# Patient Record
Sex: Female | Born: 1970 | Marital: Married | Smoking: Current every day smoker
Health system: Southern US, Academic
[De-identification: ages and names within clinical notes are randomized; demographics above are authoritative.]

## PROBLEM LIST (undated history)

## (undated) DIAGNOSIS — I10 Essential (primary) hypertension: Secondary | ICD-10-CM

## (undated) DIAGNOSIS — K589 Irritable bowel syndrome without diarrhea: Secondary | ICD-10-CM

## (undated) DIAGNOSIS — J45909 Unspecified asthma, uncomplicated: Secondary | ICD-10-CM

## (undated) DIAGNOSIS — E559 Vitamin D deficiency, unspecified: Secondary | ICD-10-CM

## (undated) DIAGNOSIS — K219 Gastro-esophageal reflux disease without esophagitis: Secondary | ICD-10-CM

## (undated) DIAGNOSIS — K449 Diaphragmatic hernia without obstruction or gangrene: Secondary | ICD-10-CM

## (undated) DIAGNOSIS — F419 Anxiety disorder, unspecified: Secondary | ICD-10-CM

## (undated) DIAGNOSIS — E7800 Pure hypercholesterolemia, unspecified: Secondary | ICD-10-CM

## (undated) DIAGNOSIS — R7303 Prediabetes: Secondary | ICD-10-CM

## (undated) HISTORY — DX: Anxiety disorder, unspecified: F41.9

## (undated) HISTORY — DX: Vitamin D deficiency, unspecified: E55.9

## (undated) HISTORY — DX: Essential (primary) hypertension: I10

## (undated) HISTORY — PX: ESOPHAGOGASTRODUODENOSCOPY: SHX1529

## (undated) HISTORY — PX: COLONOSCOPY: SHX174

## (undated) HISTORY — DX: Gastro-esophageal reflux disease without esophagitis: K21.9

## (undated) HISTORY — PX: HX RHINOPLASTY: SHX31

## (undated) HISTORY — DX: Pure hypercholesterolemia, unspecified: E78.00

## (undated) HISTORY — PX: HX APPENDECTOMY: SHX54

## (undated) HISTORY — DX: Prediabetes: R73.03

## (undated) HISTORY — DX: Unspecified asthma, uncomplicated: J45.909

## (undated) HISTORY — PX: HX GALL BLADDER SURGERY/CHOLE: SHX55

## (undated) HISTORY — PX: HX TONSILLECTOMY: SHX27

## (undated) HISTORY — DX: Diaphragmatic hernia without obstruction or gangrene: K44.9

## (undated) HISTORY — DX: Irritable bowel syndrome, unspecified: K58.9

---

## 1998-03-25 ENCOUNTER — Other Ambulatory Visit (HOSPITAL_COMMUNITY): Payer: Self-pay

## 1999-01-18 ENCOUNTER — Emergency Department (HOSPITAL_COMMUNITY): Admission: EM | Admit: 1999-01-18 | Discharge: 1999-01-19 | Payer: Self-pay | Admitting: Emergency Medicine

## 2000-10-09 ENCOUNTER — Encounter: Payer: Self-pay | Admitting: Internal Medicine

## 2000-10-09 ENCOUNTER — Emergency Department (HOSPITAL_COMMUNITY): Admission: EM | Admit: 2000-10-09 | Discharge: 2000-10-09 | Payer: Self-pay | Admitting: Emergency Medicine

## 2000-10-09 ENCOUNTER — Encounter: Payer: Self-pay | Admitting: Emergency Medicine

## 2017-02-09 DIAGNOSIS — F17200 Nicotine dependence, unspecified, uncomplicated: Secondary | ICD-10-CM | POA: Insufficient documentation

## 2017-08-24 ENCOUNTER — Ambulatory Visit (HOSPITAL_BASED_OUTPATIENT_CLINIC_OR_DEPARTMENT_OTHER): Payer: Self-pay | Admitting: Rheumatology

## 2020-11-19 ENCOUNTER — Other Ambulatory Visit: Payer: Self-pay

## 2020-11-19 ENCOUNTER — Emergency Department (HOSPITAL_COMMUNITY): Payer: Medicaid - Out of State

## 2020-11-19 ENCOUNTER — Emergency Department (HOSPITAL_COMMUNITY)
Admission: EM | Admit: 2020-11-19 | Discharge: 2020-11-20 | Disposition: A | Discharge status: Home / Self Care | Payer: Medicaid - Out of State | Attending: Emergency Medicine | Admitting: Emergency Medicine

## 2020-11-19 ENCOUNTER — Encounter (HOSPITAL_COMMUNITY): Payer: Self-pay | Admitting: Emergency Medicine

## 2020-11-19 DIAGNOSIS — R030 Elevated blood-pressure reading, without diagnosis of hypertension: Secondary | ICD-10-CM

## 2020-11-19 DIAGNOSIS — R110 Nausea: Secondary | ICD-10-CM | POA: Diagnosis not present

## 2020-11-19 DIAGNOSIS — R519 Headache, unspecified: Secondary | ICD-10-CM | POA: Diagnosis present

## 2020-11-19 DIAGNOSIS — I10 Essential (primary) hypertension: Secondary | ICD-10-CM | POA: Diagnosis not present

## 2020-11-19 DIAGNOSIS — F17210 Nicotine dependence, cigarettes, uncomplicated: Secondary | ICD-10-CM | POA: Diagnosis not present

## 2020-11-19 HISTORY — DX: Essential (primary) hypertension: I10

## 2020-11-19 LAB — CBC WITH DIFFERENTIAL/PLATELET
Abs Immature Granulocytes: 0.03 10*3/uL (ref 0.00–0.07)
Basophils Absolute: 0.1 10*3/uL (ref 0.0–0.1)
Basophils Relative: 1 %
Eosinophils Absolute: 0.3 10*3/uL (ref 0.0–0.5)
Eosinophils Relative: 2 %
HCT: 47.2 % — ABNORMAL HIGH (ref 36.0–46.0)
Hemoglobin: 16.1 g/dL — ABNORMAL HIGH (ref 12.0–15.0)
Immature Granulocytes: 0 %
Lymphocytes Relative: 37 %
Lymphs Abs: 3.9 10*3/uL (ref 0.7–4.0)
MCH: 31 pg (ref 26.0–34.0)
MCHC: 34.1 g/dL (ref 30.0–36.0)
MCV: 90.8 fL (ref 80.0–100.0)
Monocytes Absolute: 0.6 10*3/uL (ref 0.1–1.0)
Monocytes Relative: 6 %
Neutro Abs: 5.7 10*3/uL (ref 1.7–7.7)
Neutrophils Relative %: 54 %
Platelets: 235 10*3/uL (ref 150–400)
RBC: 5.2 MIL/uL — ABNORMAL HIGH (ref 3.87–5.11)
RDW: 13.2 % (ref 11.5–15.5)
WBC: 10.6 10*3/uL — ABNORMAL HIGH (ref 4.0–10.5)
nRBC: 0 % (ref 0.0–0.2)

## 2020-11-19 LAB — COMPREHENSIVE METABOLIC PANEL
ALT: 16 U/L (ref 0–44)
AST: 17 U/L (ref 15–41)
Albumin: 4.1 g/dL (ref 3.5–5.0)
Alkaline Phosphatase: 70 U/L (ref 38–126)
Anion gap: 7 (ref 5–15)
BUN: 5 mg/dL — ABNORMAL LOW (ref 6–20)
CO2: 27 mmol/L (ref 22–32)
Calcium: 9.8 mg/dL (ref 8.9–10.3)
Chloride: 100 mmol/L (ref 98–111)
Creatinine, Ser: 0.66 mg/dL (ref 0.44–1.00)
GFR, Estimated: >60 mL/min (ref >60)
Glucose, Bld: 109 mg/dL — ABNORMAL HIGH (ref 70–99)
Potassium: 3.9 mmol/L (ref 3.5–5.1)
Sodium: 134 mmol/L — ABNORMAL LOW (ref 135–145)
Total Bilirubin: 0.8 mg/dL (ref 0.3–1.2)
Total Protein: 7.1 g/dL (ref 6.5–8.1)

## 2020-11-19 NOTE — ED Provider Notes (Signed)
Emergency Medicine Provider Triage Evaluation Note  Emily Lang , a 50 y.o. female  was evaluated in triage.  Pt complains of headache and high blood pressure.  Patient with a history of high blood pressure, has been taking medication as prescribed.  She takes it at night, thus has not yet had it today.  Today around 10 AM she developed a severe headache which is been constant since.  She took some Tylenol without significant improvement.  Associated photophobia and nausea.  No vomiting.  No history of migraines.  Review of Systems  Positive: HA Negative: fever  Physical Exam  BP (!) 192/95 (BP Location: Right Arm)   Pulse 77   Temp 98.5 F (36.9 C) (Oral)   Resp 18   Ht 5\' 4"  (1.626 m)   Wt 68 kg   SpO2 100%   BMI 25.75 kg/m  Gen:   Awake, no distress   Resp:  Normal effort  MSK:   Moves extremities without difficulty   Medical Decision Making  Medically screening exam initiated at 7:20 PM.  Appropriate orders placed.  Emily Lang was informed that the remainder of the evaluation will be completed by another provider, this initial triage assessment does not replace that evaluation, and the importance of remaining in the ED until their evaluation is complete.  Labs, ct   Erle Crocker, PA-C 11/19/20 01/19/21, MD 11/20/20 1143

## 2020-11-19 NOTE — ED Triage Notes (Signed)
Pt here via [facility] from home for HTN and HA. Pt initially 190/120 for EMS, came down to 138/90. Pt still endorsing 10/10 HA. 18g LAC

## 2020-11-20 ENCOUNTER — Emergency Department (HOSPITAL_COMMUNITY): Payer: Medicaid - Out of State

## 2020-11-20 LAB — TROPONIN I (HIGH SENSITIVITY): Troponin I (High Sensitivity): 6 ng/L (ref <18)

## 2020-11-20 MED ORDER — DIPHENHYDRAMINE HCL 50 MG/ML IJ SOLN
25.0000 mg | Freq: Once | INTRAMUSCULAR | Status: AC
  Administered 2020-11-20: 25 mg via INTRAVENOUS
  Filled 2020-11-20: qty 1

## 2020-11-20 MED ORDER — KETOROLAC TROMETHAMINE 30 MG/ML IJ SOLN
30.0000 mg | Freq: Once | INTRAMUSCULAR | Status: AC
  Administered 2020-11-20: 30 mg via INTRAVENOUS
  Filled 2020-11-20: qty 1

## 2020-11-20 MED ORDER — ATENOLOL 50 MG PO TABS: 50.0000 mg | ORAL_TABLET | Freq: Every evening | ORAL | 0 refills | Status: AC

## 2020-11-20 MED ORDER — IOHEXOL 350 MG/ML SOLN
75.0000 mL | Freq: Once | INTRAVENOUS | Status: AC | PRN
  Administered 2020-11-20: 75 mL via INTRAVENOUS

## 2020-11-20 MED ORDER — METOCLOPRAMIDE HCL 5 MG/ML IJ SOLN
10.0000 mg | Freq: Once | INTRAMUSCULAR | Status: AC
  Administered 2020-11-20: 10 mg via INTRAVENOUS
  Filled 2020-11-20: qty 2

## 2020-11-20 MED ORDER — OMEPRAZOLE 40 MG PO CPDR: 40.0000 mg | DELAYED_RELEASE_CAPSULE | Freq: Every evening | ORAL | 0 refills | Status: AC
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
## 2020-11-20 NOTE — ED Notes (Signed)
pts headache is gone totally

## 2020-11-20 NOTE — ED Notes (Signed)
Patient given discharge instructions. Questions were answered. Patient verbalized understanding of discharge instructions and care at home.  

## 2020-11-20 NOTE — ED Notes (Signed)
Headache better almost gone

## 2020-11-20 NOTE — ED Notes (Signed)
To ct

## 2020-11-20 NOTE — ED Notes (Signed)
Pt given sprite and tolerated well, now asking for food

## 2020-11-20 NOTE — Discharge Instructions (Addendum)
Your testing is reassuring.  Take your blood pressure medications as prescribed and follow-up with your doctor.  Return to the ED with exertional chest pain, shortness of breath, nausea, vomiting, sweating or any other concerns.

## 2021-02-12 ENCOUNTER — Emergency Department (HOSPITAL_COMMUNITY)
Admission: EM | Admit: 2021-02-12 | Discharge: 2021-02-12 | Disposition: A | Discharge status: Home / Self Care | Payer: Medicaid - Out of State | Attending: Emergency Medicine | Admitting: Emergency Medicine

## 2021-02-12 ENCOUNTER — Emergency Department (HOSPITAL_COMMUNITY): Payer: Medicaid - Out of State

## 2021-02-12 ENCOUNTER — Other Ambulatory Visit: Payer: Self-pay

## 2021-02-12 ENCOUNTER — Encounter (HOSPITAL_COMMUNITY): Payer: Self-pay

## 2021-02-12 DIAGNOSIS — S060X0A Concussion without loss of consciousness, initial encounter: Secondary | ICD-10-CM | POA: Insufficient documentation

## 2021-02-12 DIAGNOSIS — F17210 Nicotine dependence, cigarettes, uncomplicated: Secondary | ICD-10-CM | POA: Insufficient documentation

## 2021-02-12 DIAGNOSIS — J019 Acute sinusitis, unspecified: Secondary | ICD-10-CM | POA: Diagnosis not present

## 2021-02-12 DIAGNOSIS — I10 Essential (primary) hypertension: Secondary | ICD-10-CM | POA: Diagnosis not present

## 2021-02-12 DIAGNOSIS — Z79899 Other long term (current) drug therapy: Secondary | ICD-10-CM | POA: Insufficient documentation

## 2021-02-12 DIAGNOSIS — Z7982 Long term (current) use of aspirin: Secondary | ICD-10-CM | POA: Diagnosis not present

## 2021-02-12 DIAGNOSIS — S0990XA Unspecified injury of head, initial encounter: Secondary | ICD-10-CM | POA: Diagnosis present

## 2021-02-12 MED ORDER — ACETAMINOPHEN 500 MG PO TABS
1000.0000 mg | ORAL_TABLET | Freq: Once | ORAL | Status: AC
  Administered 2021-02-12: 1000 mg via ORAL
  Filled 2021-02-12: qty 2

## 2021-02-12 MED ORDER — CYCLOBENZAPRINE HCL 10 MG PO TABS: 10.0000 mg | ORAL_TABLET | Freq: Two times a day (BID) | ORAL | 0 refills | Status: AC | PRN

## 2021-02-12 MED ORDER — AMOXICILLIN-POT CLAVULANATE 875-125 MG PO TABS: 1.0000 | ORAL_TABLET | Freq: Two times a day (BID) | ORAL | 0 refills | Status: AC

## 2021-02-12 MED ORDER — IBUPROFEN 600 MG PO TABS: 600.0000 mg | ORAL_TABLET | Freq: Four times a day (QID) | ORAL | 0 refills | Status: AC | PRN
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
## 2021-02-16 ENCOUNTER — Ambulatory Visit: Payer: Self-pay | Admitting: *Deleted
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
## 2021-02-24 ENCOUNTER — Encounter (HOSPITAL_COMMUNITY): Payer: Self-pay

## 2021-02-24 ENCOUNTER — Emergency Department (HOSPITAL_COMMUNITY)
Admission: EM | Admit: 2021-02-24 | Discharge: 2021-02-25 | Disposition: A | Discharge status: Home / Self Care | Payer: Medicaid - Out of State | Attending: Emergency Medicine | Admitting: Emergency Medicine

## 2021-02-24 DIAGNOSIS — Z5321 Procedure and treatment not carried out due to patient leaving prior to being seen by health care provider: Secondary | ICD-10-CM | POA: Diagnosis not present

## 2021-02-24 DIAGNOSIS — Z7982 Long term (current) use of aspirin: Secondary | ICD-10-CM | POA: Insufficient documentation

## 2021-02-24 DIAGNOSIS — R519 Headache, unspecified: Secondary | ICD-10-CM | POA: Insufficient documentation

## 2021-02-24 NOTE — ED Notes (Signed)
Called x1. No answer.
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
## 2021-09-17 ENCOUNTER — Other Ambulatory Visit: Payer: Self-pay

## 2021-09-17 ENCOUNTER — Other Ambulatory Visit: Payer: MEDICAID | Attending: FAMILY PRACTICE

## 2021-09-17 DIAGNOSIS — R1084 Generalized abdominal pain: Secondary | ICD-10-CM | POA: Insufficient documentation

## 2021-09-17 DIAGNOSIS — K921 Melena: Secondary | ICD-10-CM | POA: Insufficient documentation

## 2021-09-17 DIAGNOSIS — E538 Deficiency of other specified B group vitamins: Secondary | ICD-10-CM | POA: Insufficient documentation

## 2021-09-17 LAB — CBC WITH DIFF
BASOPHIL #: 0.1 10*3/uL (ref 0.00–0.30)
BASOPHIL %: 1 % (ref 0–3)
EOSINOPHIL #: 0.3 10*3/uL (ref 0.00–0.80)
EOSINOPHIL %: 3 % (ref 0–7)
HCT: 40.3 % (ref 37.0–47.0)
HGB: 14 g/dL (ref 12.5–16.0)
LYMPHOCYTE #: 3.5 10*3/uL (ref 1.10–5.00)
LYMPHOCYTE %: 37 % (ref 25–45)
MCH: 31.2 pg (ref 27.0–32.0)
MCHC: 34.9 g/dL (ref 32.0–36.0)
MCV: 89.4 fL (ref 78.0–99.0)
MONOCYTE #: 0.6 10*3/uL (ref 0.00–1.30)
MONOCYTE %: 6 % (ref 0–12)
MPV: 11 fL — ABNORMAL HIGH (ref 7.4–10.4)
NEUTROPHIL #: 5.2 10*3/uL (ref 1.80–8.40)
NEUTROPHIL %: 54 % (ref 40–76)
PLATELETS: 247 10*3/uL (ref 140–440)
RBC: 4.5 10*6/uL (ref 4.20–5.40)
RDW: 14.3 % (ref 11.6–14.8)
WBC: 9.6 10*3/uL (ref 4.0–10.5)
WBCS UNCORRECTED: 9.6 10*3/uL

## 2021-09-17 LAB — COMPREHENSIVE METABOLIC PANEL, NON-FASTING
ALBUMIN/GLOBULIN RATIO: 1.5 — ABNORMAL HIGH (ref 0.8–1.4)
ALBUMIN: 4.4 g/dL (ref 3.5–5.7)
ALKALINE PHOSPHATASE: 69 U/L (ref 34–104)
ALT (SGPT): 13 U/L (ref 7–52)
ANION GAP: 8 mmol/L — ABNORMAL LOW (ref 10–20)
AST (SGOT): 18 U/L (ref 13–39)
BILIRUBIN TOTAL: 0.3 mg/dL (ref 0.3–1.2)
BUN/CREA RATIO: 8 (ref 6–22)
BUN: 6 mg/dL — ABNORMAL LOW (ref 7–25)
CALCIUM, CORRECTED: 9.3 mg/dL (ref 8.9–10.8)
CALCIUM: 9.7 mg/dL (ref 8.6–10.3)
CHLORIDE: 106 mmol/L (ref 98–107)
CO2 TOTAL: 25 mmol/L (ref 21–31)
CREATININE: 0.79 mg/dL (ref 0.60–1.30)
ESTIMATED GFR: 91 mL/min/{1.73_m2} (ref >59)
GLOBULIN: 2.9 (ref 2.9–5.4)
GLUCOSE: 97 mg/dL (ref 74–109)
OSMOLALITY, CALCULATED: 275 mOsm/kg (ref 270–290)
POTASSIUM: 4.3 mmol/L (ref 3.5–5.1)
PROTEIN TOTAL: 7.3 g/dL (ref 6.4–8.9)
SODIUM: 139 mmol/L (ref 136–145)

## 2021-09-17 LAB — VITAMIN D 25 TOTAL: VITAMIN D: 34 ng/mL (ref 30–100)

## 2021-09-17 LAB — VITAMIN B12: VITAMIN B 12: 208 pg/mL (ref 180–914)

## 2021-09-17 LAB — THYROID STIMULATING HORMONE (SENSITIVE TSH): TSH: 1.064 u[IU]/mL (ref 0.450–5.330)

## 2021-09-18 ENCOUNTER — Other Ambulatory Visit (HOSPITAL_COMMUNITY): Payer: Self-pay | Admitting: FAMILY PRACTICE

## 2021-09-18 DIAGNOSIS — Z1231 Encounter for screening mammogram for malignant neoplasm of breast: Secondary | ICD-10-CM

## 2021-09-28 ENCOUNTER — Ambulatory Visit (HOSPITAL_COMMUNITY): Payer: Self-pay

## 2021-09-30 ENCOUNTER — Other Ambulatory Visit: Payer: Self-pay

## 2021-09-30 ENCOUNTER — Inpatient Hospital Stay
Admission: RE | Admit: 2021-09-30 | Discharge: 2021-09-30 | Disposition: A | Discharge status: Home / Self Care | Payer: MEDICAID | Source: Ambulatory Visit | Attending: FAMILY PRACTICE | Admitting: FAMILY PRACTICE

## 2021-09-30 ENCOUNTER — Encounter (HOSPITAL_COMMUNITY): Payer: Self-pay

## 2021-09-30 DIAGNOSIS — Z1231 Encounter for screening mammogram for malignant neoplasm of breast: Secondary | ICD-10-CM | POA: Insufficient documentation

## 2021-10-06 ENCOUNTER — Encounter (INDEPENDENT_AMBULATORY_CARE_PROVIDER_SITE_OTHER): Payer: Self-pay | Admitting: Surgery

## 2021-10-13 ENCOUNTER — Ambulatory Visit (INDEPENDENT_AMBULATORY_CARE_PROVIDER_SITE_OTHER): Payer: Self-pay | Admitting: Surgery

## 2021-11-02 ENCOUNTER — Ambulatory Visit (INDEPENDENT_AMBULATORY_CARE_PROVIDER_SITE_OTHER): Payer: Self-pay | Admitting: Surgery

## 2021-11-02 ENCOUNTER — Encounter (INDEPENDENT_AMBULATORY_CARE_PROVIDER_SITE_OTHER): Payer: Self-pay

## 2021-11-06 ENCOUNTER — Encounter (INDEPENDENT_AMBULATORY_CARE_PROVIDER_SITE_OTHER): Payer: Self-pay | Admitting: Surgery

## 2021-11-06 NOTE — Nursing Note (Signed)
Sent no show letter.  Kelly Gardner  11/06/2021 10:20

## 2022-03-22 ENCOUNTER — Other Ambulatory Visit (HOSPITAL_COMMUNITY): Payer: Self-pay | Admitting: FAMILY PRACTICE

## 2022-03-22 ENCOUNTER — Other Ambulatory Visit: Payer: Self-pay

## 2022-03-22 ENCOUNTER — Other Ambulatory Visit: Payer: MEDICAID | Attending: FAMILY PRACTICE

## 2022-03-22 DIAGNOSIS — I10 Essential (primary) hypertension: Secondary | ICD-10-CM | POA: Insufficient documentation

## 2022-03-22 DIAGNOSIS — R0602 Shortness of breath: Secondary | ICD-10-CM | POA: Insufficient documentation

## 2022-03-22 DIAGNOSIS — R7303 Prediabetes: Secondary | ICD-10-CM | POA: Insufficient documentation

## 2022-03-22 DIAGNOSIS — I4902 Ventricular flutter: Secondary | ICD-10-CM | POA: Insufficient documentation

## 2022-03-22 DIAGNOSIS — I4892 Unspecified atrial flutter: Secondary | ICD-10-CM

## 2022-03-22 LAB — CBC WITH DIFF
BASOPHIL #: 0.1 10*3/uL (ref 0.00–0.10)
BASOPHIL %: 1 % (ref 0–1)
EOSINOPHIL #: 0.5 10*3/uL (ref 0.00–0.50)
EOSINOPHIL %: 6 %
HCT: 44.5 % — ABNORMAL HIGH (ref 31.2–41.9)
HGB: 15 g/dL — ABNORMAL HIGH (ref 10.9–14.3)
LYMPHOCYTE #: 2.9 10*3/uL (ref 1.00–3.00)
LYMPHOCYTE %: 33 % (ref 16–44)
MCH: 30.1 pg (ref 24.7–32.8)
MCHC: 33.6 g/dL (ref 32.3–35.6)
MCV: 89.6 fL (ref 75.5–95.3)
MONOCYTE #: 0.6 10*3/uL (ref 0.30–1.00)
MONOCYTE %: 7 % (ref 5–13)
MPV: 10.7 fL (ref 7.9–10.8)
NEUTROPHIL #: 4.8 10*3/uL (ref 1.85–7.80)
NEUTROPHIL %: 54 % (ref 43–77)
PLATELETS: 216 10*3/uL (ref 140–440)
RBC: 4.96 10*6/uL — ABNORMAL HIGH (ref 3.63–4.92)
RDW: 14.8 % (ref 12.3–17.7)
WBC: 8.8 10*3/uL (ref 3.8–11.8)

## 2022-03-22 LAB — HEPATIC FUNCTION PANEL
ALBUMIN/GLOBULIN RATIO: 1.7 — ABNORMAL HIGH (ref 0.8–1.4)
ALBUMIN: 4.3 g/dL (ref 3.5–5.7)
ALKALINE PHOSPHATASE: 75 U/L (ref 34–104)
ALT (SGPT): 9 U/L (ref 7–52)
AST (SGOT): 15 U/L (ref 13–39)
BILIRUBIN DIRECT: 0.11 md/dL (ref <=0.20)
BILIRUBIN TOTAL: 0.3 mg/dL (ref 0.3–1.2)
BILIRUBIN, INDIRECT: 0.19 mg/dL (ref <=1)
GLOBULIN: 2.6 — ABNORMAL LOW (ref 2.9–5.4)
PROTEIN TOTAL: 6.9 g/dL (ref 6.4–8.9)

## 2022-03-22 LAB — BASIC METABOLIC PANEL
ANION GAP: 5 mmol/L (ref 4–13)
BUN/CREA RATIO: 8 (ref 6–22)
BUN: 6 mg/dL — ABNORMAL LOW (ref 7–25)
CALCIUM: 9.7 mg/dL (ref 8.6–10.3)
CHLORIDE: 108 mmol/L — ABNORMAL HIGH (ref 98–107)
CO2 TOTAL: 27 mmol/L (ref 21–31)
CREATININE: 0.8 mg/dL (ref 0.60–1.30)
ESTIMATED GFR: 89 mL/min/{1.73_m2} (ref >59)
GLUCOSE: 102 mg/dL (ref 74–109)
OSMOLALITY, CALCULATED: 277 mOsm/kg (ref 270–290)
POTASSIUM: 4.3 mmol/L (ref 3.5–5.1)
SODIUM: 140 mmol/L (ref 136–145)

## 2022-03-22 LAB — MAGNESIUM: MAGNESIUM: 1.9 mg/dL (ref 1.9–2.7)

## 2022-03-22 LAB — THYROID STIMULATING HORMONE (SENSITIVE TSH): TSH: 1.568 u[IU]/mL (ref 0.450–5.330)

## 2022-03-22 LAB — HGA1C (HEMOGLOBIN A1C WITH EST AVG GLUCOSE): HEMOGLOBIN A1C: 5.5 % (ref 4.0–6.0)

## 2022-03-30 ENCOUNTER — Ambulatory Visit (HOSPITAL_COMMUNITY): Payer: MEDICAID

## 2022-03-30 ENCOUNTER — Ambulatory Visit (HOSPITAL_COMMUNITY): Payer: Self-pay

## 2022-04-20 ENCOUNTER — Ambulatory Visit (HOSPITAL_COMMUNITY): Payer: Self-pay

## 2022-04-20 ENCOUNTER — Ambulatory Visit (HOSPITAL_COMMUNITY): Payer: MEDICAID

## 2022-05-03 ENCOUNTER — Ambulatory Visit (HOSPITAL_COMMUNITY): Payer: Self-pay

## 2022-05-28 ENCOUNTER — Encounter (INDEPENDENT_AMBULATORY_CARE_PROVIDER_SITE_OTHER): Payer: Self-pay | Admitting: Surgery

## 2022-06-08 ENCOUNTER — Ambulatory Visit (HOSPITAL_COMMUNITY): Payer: Self-pay

## 2022-06-21 ENCOUNTER — Encounter (INDEPENDENT_AMBULATORY_CARE_PROVIDER_SITE_OTHER): Payer: Self-pay

## 2022-06-21 ENCOUNTER — Ambulatory Visit (INDEPENDENT_AMBULATORY_CARE_PROVIDER_SITE_OTHER): Payer: MEDICAID | Admitting: Surgery

## 2022-06-28 ENCOUNTER — Encounter (INDEPENDENT_AMBULATORY_CARE_PROVIDER_SITE_OTHER): Payer: Self-pay | Admitting: Surgery

## 2022-06-28 ENCOUNTER — Ambulatory Visit (HOSPITAL_COMMUNITY): Payer: Self-pay

## 2022-06-29 ENCOUNTER — Ambulatory Visit (INDEPENDENT_AMBULATORY_CARE_PROVIDER_SITE_OTHER): Payer: Self-pay | Admitting: Surgery

## 2023-02-09 ENCOUNTER — Other Ambulatory Visit (INDEPENDENT_AMBULATORY_CARE_PROVIDER_SITE_OTHER): Payer: Self-pay | Admitting: PHYSICIAN ASSISTANT

## 2023-02-09 MED ORDER — ATENOLOL 50 MG TABLET
50.0000 mg | ORAL_TABLET | Freq: Every day | ORAL | 0 refills | Status: DC
Start: 2023-02-09 — End: 2023-05-18

## 2023-02-09 MED ORDER — OMEPRAZOLE 40 MG CAPSULE,DELAYED RELEASE
40.0000 mg | DELAYED_RELEASE_CAPSULE | Freq: Every day | ORAL | 0 refills | Status: DC
Start: 2023-02-09 — End: 2023-05-18

## 2023-02-09 MED ORDER — ZAFIRLUKAST 20 MG TABLET
20.0000 mg | ORAL_TABLET | Freq: Two times a day (BID) | ORAL | 0 refills | Status: DC
Start: 2023-02-09 — End: 2023-05-18

## 2023-02-09 MED ORDER — LORATADINE 10 MG TABLET
10.0000 mg | ORAL_TABLET | Freq: Every day | ORAL | 0 refills | Status: DC
Start: 2023-02-09 — End: 2023-05-18

## 2023-02-09 MED ORDER — SPIRONOLACTONE 25 MG TABLET
25.0000 mg | ORAL_TABLET | Freq: Every day | ORAL | 0 refills | Status: DC
Start: 2023-02-09 — End: 2023-05-18

## 2023-03-23 ENCOUNTER — Encounter (INDEPENDENT_AMBULATORY_CARE_PROVIDER_SITE_OTHER): Payer: Self-pay | Admitting: PHYSICIAN ASSISTANT
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
## 2023-05-18 ENCOUNTER — Encounter (INDEPENDENT_AMBULATORY_CARE_PROVIDER_SITE_OTHER): Payer: Self-pay | Admitting: PHYSICIAN ASSISTANT

## 2023-05-18 ENCOUNTER — Other Ambulatory Visit: Payer: Self-pay

## 2023-05-18 ENCOUNTER — Ambulatory Visit (INDEPENDENT_AMBULATORY_CARE_PROVIDER_SITE_OTHER): Payer: Self-pay | Admitting: PHYSICIAN ASSISTANT

## 2023-05-18 VITALS — BP 140/84 | HR 74 | Temp 98.3°F | Resp 16 | Ht 64.0 in | Wt 160.0 lb

## 2023-05-18 DIAGNOSIS — M25522 Pain in left elbow: Secondary | ICD-10-CM

## 2023-05-18 DIAGNOSIS — M2550 Pain in unspecified joint: Secondary | ICD-10-CM

## 2023-05-18 DIAGNOSIS — J4530 Mild persistent asthma, uncomplicated: Secondary | ICD-10-CM

## 2023-05-18 DIAGNOSIS — M25521 Pain in right elbow: Secondary | ICD-10-CM

## 2023-05-18 DIAGNOSIS — M25561 Pain in right knee: Secondary | ICD-10-CM

## 2023-05-18 DIAGNOSIS — K219 Gastro-esophageal reflux disease without esophagitis: Secondary | ICD-10-CM

## 2023-05-18 DIAGNOSIS — I10 Essential (primary) hypertension: Secondary | ICD-10-CM

## 2023-05-18 DIAGNOSIS — T7840XA Allergy, unspecified, initial encounter: Secondary | ICD-10-CM

## 2023-05-18 DIAGNOSIS — M549 Dorsalgia, unspecified: Secondary | ICD-10-CM

## 2023-05-18 DIAGNOSIS — Z1231 Encounter for screening mammogram for malignant neoplasm of breast: Secondary | ICD-10-CM

## 2023-05-18 DIAGNOSIS — M25562 Pain in left knee: Secondary | ICD-10-CM

## 2023-05-18 MED ORDER — IBUPROFEN 600 MG TABLET
600.0000 mg | ORAL_TABLET | Freq: Two times a day (BID) | ORAL | 2 refills | Status: DC | PRN
Start: 2023-05-18 — End: 2023-08-15

## 2023-05-18 MED ORDER — LORATADINE 10 MG TABLET
10.0000 mg | ORAL_TABLET | Freq: Every day | ORAL | 0 refills | Status: DC
Start: 2023-05-18 — End: 2023-08-15

## 2023-05-18 MED ORDER — ATENOLOL 50 MG TABLET
50.0000 mg | ORAL_TABLET | Freq: Every day | ORAL | 0 refills | Status: DC
Start: 2023-05-18 — End: 2023-08-15

## 2023-05-18 MED ORDER — SPIRONOLACTONE 25 MG TABLET
25.0000 mg | ORAL_TABLET | Freq: Every day | ORAL | 0 refills | Status: DC
Start: 2023-05-18 — End: 2023-08-15

## 2023-05-18 MED ORDER — ZAFIRLUKAST 20 MG TABLET
20.0000 mg | ORAL_TABLET | Freq: Two times a day (BID) | ORAL | 0 refills | Status: DC
Start: 2023-05-18 — End: 2023-08-15

## 2023-05-18 MED ORDER — OMEPRAZOLE 40 MG CAPSULE,DELAYED RELEASE
40.0000 mg | DELAYED_RELEASE_CAPSULE | Freq: Every day | ORAL | 0 refills | Status: DC
Start: 2023-05-18 — End: 2023-08-15

## 2023-05-18 MED ORDER — VENTOLIN HFA 90 MCG/ACTUATION AEROSOL INHALER
2.0000 | INHALATION_SPRAY | Freq: Four times a day (QID) | RESPIRATORY_TRACT | 2 refills | Status: DC | PRN
Start: 2023-05-18 — End: 2023-08-15
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
## 2023-05-18 NOTE — Nursing Note (Signed)
05/18/23 1622   Depression Screen   Little interest or pleasure in doing things. 0   Feeling down, depressed, or hopeless 0   PHQ 2 Total 0

## 2023-05-18 NOTE — Progress Notes (Signed)
[facility]  [facility], MEDICAL OFFICE [facility]  [address]  [place] [address]  [phone]   Kelly Gardner  02-26-1971  [id number]    Date of Service: 05/18/2023  Chief complaint:   Chief Complaint   Patient presents with    Follow Up     Subjective:   Kelly Gardner is a 53 y.o. female and presents today for follow-up on chronic disease management. Needs refills on medications. She notes some increased joint pains in her knees, back, and elbows with the cold weather.   Denies chest pain, heart flutters, dizziness, nausea, vomiting, diarrhea, and constipation.   She c/o shortness of breath with cold air.   Been out of stomach meds for a few days and heartburn has returned. Taking rolaids OTC with minimal relief.   She is scheduled with Dr. Vida Rigger for IUD removal.   Needs mammogram.      Past Medical History:   Diagnosis Date    Anxiety     Asthma     Esophageal reflux     Hiatal hernia     Hypercholesterolemia     Hypertension     Irritable bowel syndrome     Prediabetes     Vitamin D deficiency          Past Surgical History:   Procedure Laterality Date    COLONOSCOPY      ESOPHAGOGASTRODUODENOSCOPY      HX APPENDECTOMY      HX CESAREAN SECTION      HX CHOLECYSTECTOMY      HX RHINOPLASTY      HX TONSILLECTOMY           Current Outpatient Medications   Medication Sig Dispense Refill    ADVAIR DISKUS 250-50 mcg/dose Inhalation oral diskus inhaler Take 1 Puff (1 Inhalation total) by inhalation      aspirin (ECOTRIN) 81 mg Oral Tablet, Delayed Release (E.C.) Take 1 Tablet (81 mg total) by mouth Once a day      atenoloL (TENORMIN) 50 mg Oral Tablet Take 1 Tablet (50 mg total) by mouth Once a day for 90 days 90 Tablet 0    BD LUER-LOK SYRINGE 3 mL 25 gauge x 1" Syringe USE AS DIRECTED (Patient not taking: Reported on 05/18/2023)      ergocalciferol, vitamin D2, (DRISDOL) 1,250 mcg (50,000 unit) Oral Capsule TAKE 1 CAPSULE BY MOUTH EVERY TWO WEEKS (Patient not taking:  Reported on 05/18/2023)      Ibuprofen (MOTRIN) 600 mg Oral Tablet Take 1 Tablet (600 mg total) by mouth Twice per day as needed for Pain for up to 90 days 60 Tablet 2    loratadine (CLARITIN) 10 mg Oral Tablet Take 1 Tablet (10 mg total) by mouth Once a day for 90 days 90 Tablet 0    omeprazole (PRILOSEC) 40 mg Oral Capsule, Delayed Release(E.C.) Take 1 Capsule (40 mg total) by mouth Once a day for 90 days 90 Capsule 0    spironolactone (ALDACTONE) 25 mg Oral Tablet Take 1 Tablet (25 mg total) by mouth Once a day for 90 days 90 Tablet 0    VENTOLIN HFA 90 mcg/actuation Inhalation oral inhaler Take 2 Puffs by inhalation Every 6 hours as needed for up to 90 days 8 g 2    zafirlukast (ACCOLATE) 20 mg Oral Tablet Take 1 Tablet (20 mg total) by mouth Twice a day before meals for 90 days 180 Tablet 0     No current facility-administered  medications for this visit.     Allergies   Allergen Reactions    Buprenorphine     Montelukast      Leg cramps    Tetracyclines      Review of Systems:  Any pertinent Review of Systems as addressed in the HPI above.    Objective:   BP (!) 140/84 (Site: Right Arm, Patient Position: Sitting, Cuff Size: Adult)   Pulse 74   Temp 36.8 C (98.3 F) (Temporal)   Resp 16   Ht 1.626 m (5\' 4" )   Wt 72.6 kg (160 lb)   SpO2 97%   BMI 27.46 kg/m       BMI addressed: Advised on diet, weight loss, and exercise to reduce above normal BMI.       Constitutional: No acute distress. Alert and oriented.  Head: Normocephalic, atraumatic.  Eyes: Clear. PERRL. EOMI.  Ears: Canals and TMs clear and intact bilaterally.  Nose: Patent. No rhinorrhea.  Throat: Clear, no erythema or drainge.  Neck: Supple. No lymphadenopathy. No bruit.   Lungs: Clear to auscultation bilaterally. No wheezes, rhonchi, or rales.  Heart: Regular rate and rhythm. No murmurs.  Abdomen: Soft, non-tender, nondistended. BS active.  Extremities: Intact x 4. No edema. Gait stable.  Neuro: CN 2-12 grossly intact.   Psych: Mood and affect  appropriate and congruent.    Assessment & Plan  Mild persistent asthma, unspecified whether complicated    Orders:    zafirlukast (ACCOLATE) 20 mg Oral Tablet; Take 1 Tablet (20 mg total) by mouth Twice a day before meals for 90 days    VENTOLIN HFA 90 mcg/actuation Inhalation oral inhaler; Take 2 Puffs by inhalation Every 6 hours as needed for up to 90 days    loratadine (CLARITIN) 10 mg Oral Tablet; Take 1 Tablet (10 mg total) by mouth Once a day for 90 days    Hypertension, unspecified type    Orders:    spironolactone (ALDACTONE) 25 mg Oral Tablet; Take 1 Tablet (25 mg total) by mouth Once a day for 90 days    atenoloL (TENORMIN) 50 mg Oral Tablet; Take 1 Tablet (50 mg total) by mouth Once a day for 90 days    Arthralgia, unspecified joint    Orders:    Ibuprofen (MOTRIN) 600 mg Oral Tablet; Take 1 Tablet (600 mg total) by mouth Twice per day as needed for Pain for up to 90 days    Allergies    Orders:    loratadine (CLARITIN) 10 mg Oral Tablet; Take 1 Tablet (10 mg total) by mouth Once a day for 90 days    Gastroesophageal reflux disease, unspecified whether esophagitis present    Orders:    omeprazole (PRILOSEC) 40 mg Oral Capsule, Delayed Release(E.C.); Take 1 Capsule (40 mg total) by mouth Once a day for 90 days    Breast cancer screening by mammogram    Orders:    MAMMO BILATERAL SCREENING; Future           Counseling with regards to preventative care given. Medication summary was discussed with the patient to verify compliance and understanding. Medication safety was discussed. A good faith effort was made to reconcile the patient's medications.     The patient was given the opportunity to ask questions and those questions were answered to the patient's satisfaction. The patient was encouraged to call with any additional questions or concerns. More than 50% of the visit was spent counseling and coordinating care.    Orders  Placed This Encounter    MAMMO BILATERAL SCREENING    zafirlukast (ACCOLATE) 20 mg  Oral Tablet    VENTOLIN HFA 90 mcg/actuation Inhalation oral inhaler    spironolactone (ALDACTONE) 25 mg Oral Tablet    omeprazole (PRILOSEC) 40 mg Oral Capsule, Delayed Release(E.C.)    loratadine (CLARITIN) 10 mg Oral Tablet    Ibuprofen (MOTRIN) 600 mg Oral Tablet    atenoloL (TENORMIN) 50 mg Oral Tablet     Tobacco cessation counseling performed.   Depression screening is negative. PHQ 2 Total: 0              Return in about 3 months (around 08/16/2023).  [address], PA-C 05/18/2023, 17:00

## 2023-06-07 ENCOUNTER — Encounter (HOSPITAL_COMMUNITY): Payer: Self-pay

## 2023-06-07 ENCOUNTER — Ambulatory Visit
Admission: RE | Admit: 2023-06-07 | Discharge: 2023-06-07 | Disposition: A | Discharge status: Home / Self Care | Payer: MEDICAID | Source: Ambulatory Visit | Attending: PHYSICIAN ASSISTANT | Admitting: PHYSICIAN ASSISTANT

## 2023-06-07 ENCOUNTER — Other Ambulatory Visit: Payer: Self-pay

## 2023-06-07 DIAGNOSIS — Z1231 Encounter for screening mammogram for malignant neoplasm of breast: Secondary | ICD-10-CM | POA: Insufficient documentation

## 2023-06-08 ENCOUNTER — Ambulatory Visit (INDEPENDENT_AMBULATORY_CARE_PROVIDER_SITE_OTHER): Payer: Self-pay | Admitting: PHYSICIAN ASSISTANT

## 2023-06-08 ENCOUNTER — Encounter (INDEPENDENT_AMBULATORY_CARE_PROVIDER_SITE_OTHER): Payer: Self-pay | Admitting: PHYSICIAN ASSISTANT

## 2023-06-08 DIAGNOSIS — Z1231 Encounter for screening mammogram for malignant neoplasm of breast: Secondary | ICD-10-CM

## 2023-06-23 ENCOUNTER — Encounter (INDEPENDENT_AMBULATORY_CARE_PROVIDER_SITE_OTHER): Payer: Self-pay | Admitting: PHYSICIAN ASSISTANT

## 2023-06-23 NOTE — Telephone Encounter (Signed)
 Mammogram Result Letter mailed out on the 06/08/23.

## 2023-08-12 ENCOUNTER — Other Ambulatory Visit (INDEPENDENT_AMBULATORY_CARE_PROVIDER_SITE_OTHER): Payer: Self-pay | Admitting: PHYSICIAN ASSISTANT

## 2023-08-12 DIAGNOSIS — J4530 Mild persistent asthma, uncomplicated: Secondary | ICD-10-CM

## 2023-08-12 DIAGNOSIS — I10 Essential (primary) hypertension: Secondary | ICD-10-CM

## 2023-08-12 DIAGNOSIS — T7840XA Allergy, unspecified, initial encounter: Secondary | ICD-10-CM

## 2023-08-12 DIAGNOSIS — K219 Gastro-esophageal reflux disease without esophagitis: Secondary | ICD-10-CM

## 2023-08-12 DIAGNOSIS — M2550 Pain in unspecified joint: Secondary | ICD-10-CM

## 2023-08-15 MED ORDER — SPIRONOLACTONE 25 MG TABLET
25.0000 mg | ORAL_TABLET | Freq: Every day | ORAL | 0 refills | Status: DC
Start: 2023-08-15 — End: 2023-11-25

## 2023-08-15 MED ORDER — ATENOLOL 50 MG TABLET
50.0000 mg | ORAL_TABLET | Freq: Every day | ORAL | 0 refills | Status: DC
Start: 2023-08-15 — End: 2023-11-25

## 2023-08-15 MED ORDER — IBUPROFEN 600 MG TABLET
600.0000 mg | ORAL_TABLET | Freq: Two times a day (BID) | ORAL | 0 refills | Status: AC | PRN
Start: 2023-08-15 — End: 2023-11-25

## 2023-08-15 MED ORDER — OMEPRAZOLE 40 MG CAPSULE,DELAYED RELEASE
40.0000 mg | DELAYED_RELEASE_CAPSULE | Freq: Every day | ORAL | 0 refills | Status: DC
Start: 2023-08-15 — End: 2023-11-25

## 2023-08-15 MED ORDER — ZAFIRLUKAST 20 MG TABLET
20.0000 mg | ORAL_TABLET | Freq: Two times a day (BID) | ORAL | 0 refills | Status: AC
Start: 2023-08-15 — End: 2023-11-25

## 2023-08-15 MED ORDER — LORATADINE 10 MG TABLET
10.0000 mg | ORAL_TABLET | Freq: Every day | ORAL | 0 refills | Status: DC
Start: 2023-08-15 — End: 2023-11-25

## 2023-08-15 MED ORDER — VENTOLIN HFA 90 MCG/ACTUATION AEROSOL INHALER
2.0000 | INHALATION_SPRAY | Freq: Four times a day (QID) | RESPIRATORY_TRACT | 2 refills | Status: DC | PRN
Start: 2023-08-15 — End: 2023-11-25

## 2023-08-16 ENCOUNTER — Ambulatory Visit (INDEPENDENT_AMBULATORY_CARE_PROVIDER_SITE_OTHER): Payer: Self-pay | Admitting: PHYSICIAN ASSISTANT

## 2023-08-24 ENCOUNTER — Ambulatory Visit (INDEPENDENT_AMBULATORY_CARE_PROVIDER_SITE_OTHER): Payer: Self-pay | Admitting: PHYSICIAN ASSISTANT

## 2023-11-25 ENCOUNTER — Encounter (INDEPENDENT_AMBULATORY_CARE_PROVIDER_SITE_OTHER): Payer: Self-pay | Admitting: PHYSICIAN ASSISTANT

## 2023-11-25 ENCOUNTER — Ambulatory Visit: Payer: MEDICAID | Attending: PHYSICIAN ASSISTANT | Admitting: PHYSICIAN ASSISTANT

## 2023-11-25 ENCOUNTER — Other Ambulatory Visit: Payer: Self-pay

## 2023-11-25 VITALS — BP 138/91 | HR 81 | Temp 98.2°F | Ht 64.0 in | Wt 169.0 lb

## 2023-11-25 DIAGNOSIS — E559 Vitamin D deficiency, unspecified: Secondary | ICD-10-CM | POA: Insufficient documentation

## 2023-11-25 DIAGNOSIS — E785 Hyperlipidemia, unspecified: Secondary | ICD-10-CM | POA: Insufficient documentation

## 2023-11-25 DIAGNOSIS — R7303 Prediabetes: Secondary | ICD-10-CM | POA: Insufficient documentation

## 2023-11-25 DIAGNOSIS — T7840XA Allergy, unspecified, initial encounter: Secondary | ICD-10-CM | POA: Insufficient documentation

## 2023-11-25 DIAGNOSIS — J4530 Mild persistent asthma, uncomplicated: Secondary | ICD-10-CM | POA: Insufficient documentation

## 2023-11-25 DIAGNOSIS — K219 Gastro-esophageal reflux disease without esophagitis: Secondary | ICD-10-CM | POA: Insufficient documentation

## 2023-11-25 DIAGNOSIS — E538 Deficiency of other specified B group vitamins: Secondary | ICD-10-CM | POA: Insufficient documentation

## 2023-11-25 DIAGNOSIS — I10 Essential (primary) hypertension: Secondary | ICD-10-CM | POA: Insufficient documentation

## 2023-11-25 DIAGNOSIS — Z975 Presence of (intrauterine) contraceptive device: Secondary | ICD-10-CM | POA: Insufficient documentation

## 2023-11-25 LAB — A1C (POINT OF CARE): POCT HGB A1C: 5.8 % (ref 4–6)

## 2023-11-25 MED ORDER — LORATADINE 10 MG TABLET
10.0000 mg | ORAL_TABLET | Freq: Every day | ORAL | 0 refills | Status: DC
Start: 2023-11-25 — End: 2024-02-17

## 2023-11-25 MED ORDER — ATENOLOL 50 MG TABLET
50.0000 mg | ORAL_TABLET | Freq: Every day | ORAL | 0 refills | Status: DC
Start: 2023-11-25 — End: 2024-02-17

## 2023-11-25 MED ORDER — SPIRONOLACTONE 25 MG TABLET
25.0000 mg | ORAL_TABLET | Freq: Every day | ORAL | 0 refills | Status: DC
Start: 2023-11-25 — End: 2024-02-17

## 2023-11-25 MED ORDER — OMEPRAZOLE 40 MG CAPSULE,DELAYED RELEASE
40.0000 mg | DELAYED_RELEASE_CAPSULE | Freq: Every day | ORAL | 0 refills | Status: DC
Start: 2023-11-25 — End: 2024-02-17

## 2023-11-25 MED ORDER — VENTOLIN HFA 90 MCG/ACTUATION AEROSOL INHALER
2.0000 | INHALATION_SPRAY | Freq: Four times a day (QID) | RESPIRATORY_TRACT | 2 refills | Status: AC | PRN
Start: 2023-11-25 — End: 2024-02-23
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
## 2023-11-30 ENCOUNTER — Other Ambulatory Visit (INDEPENDENT_AMBULATORY_CARE_PROVIDER_SITE_OTHER): Payer: Self-pay

## 2023-11-30 ENCOUNTER — Telehealth (INDEPENDENT_AMBULATORY_CARE_PROVIDER_SITE_OTHER): Payer: Self-pay | Admitting: PHYSICIAN ASSISTANT

## 2023-11-30 MED ORDER — CYANOCOBALAMIN (VIT B-12) 1,000 MCG/ML INJECTION SOLUTION
INTRAMUSCULAR | 0 refills | Status: AC
Start: 2023-11-30 — End: ?

## 2023-11-30 MED ORDER — ROSUVASTATIN 5 MG TABLET
5.0000 mg | ORAL_TABLET | Freq: Every evening | ORAL | 0 refills | Status: AC
Start: 2023-11-30 — End: ?

## 2023-11-30 MED ORDER — ERGOCALCIFEROL (VITAMIN D2) 1,250 MCG (50,000 UNIT) CAPSULE
50000.0000 [IU] | ORAL_CAPSULE | ORAL | 0 refills | Status: AC
Start: 2023-11-30 — End: ?

## 2023-11-30 MED ORDER — SYRINGE 3 ML 25 GAUGE X 1"
INJECTION | 0 refills | Status: AC
Start: 2023-11-30 — End: ?

## 2023-11-30 NOTE — Telephone Encounter (Signed)
-----   Message from Gerardine Ammon, PA-C sent at 11/29/2023  8:17 PM EDT -----  Regarding: labs  Labs show elevated chol and LDL and trig  Start Lipitor 10 mg 1 tab daily #90/0 and recheck 3 months  Hga1c pre-diabetes range decrease carbs/sugars in diet  Vitamin d  is really low start vitamin d  50,000 iu 1 po every week with food #12/0  B12 is really low start B12 1 cc IM once a week for month and then 1cc IM once a month for 2 months    Hard copy scanned into EHR

## 2023-11-30 NOTE — Telephone Encounter (Signed)
 Left detail message and sent rx to pt. Pharmacy.

## 2023-11-30 NOTE — Telephone Encounter (Signed)
 Spoke with pt. Voiced understanding, pt. Stated that she would like to try something else besides Lipitor, it made her dizzy and blood sugar drops on her.

## 2023-12-06 ENCOUNTER — Encounter (INDEPENDENT_AMBULATORY_CARE_PROVIDER_SITE_OTHER): Payer: Self-pay | Admitting: PHYSICIAN ASSISTANT

## 2024-02-17 ENCOUNTER — Other Ambulatory Visit (INDEPENDENT_AMBULATORY_CARE_PROVIDER_SITE_OTHER): Payer: Self-pay | Admitting: PHYSICIAN ASSISTANT

## 2024-02-17 DIAGNOSIS — J4530 Mild persistent asthma, uncomplicated: Secondary | ICD-10-CM

## 2024-02-17 DIAGNOSIS — I10 Essential (primary) hypertension: Secondary | ICD-10-CM

## 2024-02-17 DIAGNOSIS — T7840XA Allergy, unspecified, initial encounter: Secondary | ICD-10-CM

## 2024-02-17 DIAGNOSIS — K219 Gastro-esophageal reflux disease without esophagitis: Secondary | ICD-10-CM

## 2024-02-17 MED ORDER — SPIRONOLACTONE 25 MG TABLET: 25.0000 mg | ORAL_TABLET | Freq: Every day | ORAL | 0 refills | Status: DC

## 2024-02-17 MED ORDER — ATENOLOL 50 MG TABLET: 50.0000 mg | ORAL_TABLET | Freq: Every day | ORAL | 0 refills | Status: DC

## 2024-02-17 MED ORDER — OMEPRAZOLE 40 MG CAPSULE,DELAYED RELEASE
40.0000 mg | DELAYED_RELEASE_CAPSULE | Freq: Every day | ORAL | 0 refills | Status: AC
Start: 2024-02-17 — End: 2024-04-17

## 2024-02-17 MED ORDER — LORATADINE 10 MG TABLET
10.0000 mg | ORAL_TABLET | Freq: Every day | ORAL | 0 refills | Status: AC
Start: 2024-02-17 — End: 2024-04-17

## 2024-02-27 ENCOUNTER — Other Ambulatory Visit (INDEPENDENT_AMBULATORY_CARE_PROVIDER_SITE_OTHER): Payer: Self-pay

## 2024-02-29 ENCOUNTER — Ambulatory Visit (INDEPENDENT_AMBULATORY_CARE_PROVIDER_SITE_OTHER): Payer: Self-pay | Admitting: PHYSICIAN ASSISTANT

## 2024-04-02 ENCOUNTER — Ambulatory Visit (INDEPENDENT_AMBULATORY_CARE_PROVIDER_SITE_OTHER): Payer: Self-pay | Admitting: PHYSICIAN ASSISTANT

## 2024-04-16 ENCOUNTER — Other Ambulatory Visit (INDEPENDENT_AMBULATORY_CARE_PROVIDER_SITE_OTHER): Payer: Self-pay | Admitting: PHYSICIAN ASSISTANT

## 2024-04-16 DIAGNOSIS — I10 Essential (primary) hypertension: Secondary | ICD-10-CM
